# Patient Record
Sex: Male | Born: 1955 | Race: White | Hispanic: No | Marital: Single | Smoking: Former smoker
Health system: Southern US, Community
[De-identification: ages and names within clinical notes are randomized; demographics above are authoritative.]

## PROBLEM LIST (undated history)

## (undated) DIAGNOSIS — Q240 Dextrocardia: Secondary | ICD-10-CM

## (undated) DIAGNOSIS — E7800 Pure hypercholesterolemia, unspecified: Secondary | ICD-10-CM

## (undated) DIAGNOSIS — E119 Type 2 diabetes mellitus without complications: Secondary | ICD-10-CM

## (undated) DIAGNOSIS — R911 Solitary pulmonary nodule: Secondary | ICD-10-CM

## (undated) DIAGNOSIS — J329 Chronic sinusitis, unspecified: Secondary | ICD-10-CM

## (undated) DIAGNOSIS — G4730 Sleep apnea, unspecified: Secondary | ICD-10-CM

## (undated) DIAGNOSIS — I10 Essential (primary) hypertension: Secondary | ICD-10-CM

## (undated) DIAGNOSIS — J45909 Unspecified asthma, uncomplicated: Secondary | ICD-10-CM

## (undated) DIAGNOSIS — J449 Chronic obstructive pulmonary disease, unspecified: Secondary | ICD-10-CM

## (undated) HISTORY — PX: CHOLECYSTECTOMY: SHX55

---

## 2013-09-11 ENCOUNTER — Emergency Department (HOSPITAL_COMMUNITY)
Admission: EM | Admit: 2013-09-11 | Discharge: 2013-09-11 | Attending: Emergency Medicine | Admitting: Emergency Medicine

## 2013-09-11 ENCOUNTER — Encounter (HOSPITAL_COMMUNITY): Payer: Self-pay | Admitting: Emergency Medicine

## 2013-09-11 ENCOUNTER — Emergency Department (HOSPITAL_COMMUNITY)

## 2013-09-11 DIAGNOSIS — Q248 Other specified congenital malformations of heart: Secondary | ICD-10-CM | POA: Insufficient documentation

## 2013-09-11 DIAGNOSIS — I10 Essential (primary) hypertension: Secondary | ICD-10-CM | POA: Insufficient documentation

## 2013-09-11 DIAGNOSIS — J90 Pleural effusion, not elsewhere classified: Secondary | ICD-10-CM | POA: Insufficient documentation

## 2013-09-11 DIAGNOSIS — J45901 Unspecified asthma with (acute) exacerbation: Principal | ICD-10-CM

## 2013-09-11 DIAGNOSIS — E119 Type 2 diabetes mellitus without complications: Secondary | ICD-10-CM | POA: Insufficient documentation

## 2013-09-11 DIAGNOSIS — J45909 Unspecified asthma, uncomplicated: Secondary | ICD-10-CM

## 2013-09-11 DIAGNOSIS — Z87891 Personal history of nicotine dependence: Secondary | ICD-10-CM | POA: Insufficient documentation

## 2013-09-11 DIAGNOSIS — J441 Chronic obstructive pulmonary disease with (acute) exacerbation: Secondary | ICD-10-CM | POA: Insufficient documentation

## 2013-09-11 HISTORY — DX: Dextrocardia: Q24.0

## 2013-09-11 HISTORY — DX: Chronic sinusitis, unspecified: J32.9

## 2013-09-11 HISTORY — DX: Type 2 diabetes mellitus without complications: E11.9

## 2013-09-11 HISTORY — DX: Essential (primary) hypertension: I10

## 2013-09-11 HISTORY — DX: Unspecified asthma, uncomplicated: J45.909

## 2013-09-11 HISTORY — DX: Solitary pulmonary nodule: R91.1

## 2013-09-11 HISTORY — DX: Sleep apnea, unspecified: G47.30

## 2013-09-11 HISTORY — DX: Chronic obstructive pulmonary disease, unspecified: J44.9

## 2013-09-11 HISTORY — DX: Pure hypercholesterolemia, unspecified: E78.00

## 2013-09-11 MED ORDER — ALBUTEROL SULFATE (2.5 MG/3ML) 0.083% IN NEBU
2.5000 mg | INHALATION_SOLUTION | Freq: Once | RESPIRATORY_TRACT | Status: AC
  Administered 2013-09-11: 2.5 mg via RESPIRATORY_TRACT
  Filled 2013-09-11: qty 3

## 2013-09-11 MED ORDER — CIPROFLOXACIN HCL 500 MG PO TABS: 500.0000 mg | ORAL_TABLET | Freq: Two times a day (BID) | ORAL | Status: DC

## 2013-09-11 MED ORDER — PREDNISONE 50 MG PO TABS
60.0000 mg | ORAL_TABLET | Freq: Once | ORAL | Status: AC
  Administered 2013-09-11: 60 mg via ORAL
  Filled 2013-09-11 (×2): qty 1

## 2013-09-11 MED ORDER — IPRATROPIUM-ALBUTEROL 0.5-2.5 (3) MG/3ML IN SOLN
3.0000 mL | Freq: Once | RESPIRATORY_TRACT | Status: AC
  Administered 2013-09-11: 3 mL via RESPIRATORY_TRACT
  Filled 2013-09-11: qty 3

## 2013-09-11 MED ORDER — PREDNISONE 20 MG PO TABS: ORAL_TABLET | ORAL | Status: DC

## 2013-09-11 MED ORDER — ALBUTEROL SULFATE HFA 108 (90 BASE) MCG/ACT IN AERS
2.0000 | INHALATION_SPRAY | Freq: Once | RESPIRATORY_TRACT | Status: AC
  Administered 2013-09-11: 2 via RESPIRATORY_TRACT
  Filled 2013-09-11: qty 6.7

## 2013-09-11 MED ORDER — CIPROFLOXACIN HCL 250 MG PO TABS
500.0000 mg | ORAL_TABLET | Freq: Once | ORAL | Status: AC
  Administered 2013-09-11: 500 mg via ORAL
  Filled 2013-09-11: qty 2

## 2013-09-11 NOTE — ED Notes (Signed)
Pt states he "feels better" after second breathing tx. Pt was able to ambulate to and from restroom without becoming SOB.

## 2013-09-11 NOTE — ED Notes (Addendum)
Pt c/o cough, head and chest congestion,  Sob with exertion, intermittent fever for the past week, cough is productive with brown phlegm, pt has audible wheezing noted at times during triage, states that he last used his inhaler this am, resp therapy paged for breathing tx,

## 2013-09-11 NOTE — Discharge Instructions (Signed)
Chest x-ray shows a left pleural effusion. Recommend repeat chest x-ray on Thursday. Prescriptions for antibiotic and prednisone given. Use your inhaler 2 puffs every 3 hours.  Followup medical system at the facility.

## 2013-09-11 NOTE — ED Provider Notes (Signed)
CSN: [id number]     Arrival date & time 09/11/13  1021 History  This chart was scribed for Donnetta HutchingBrian Tanaysia Bhardwaj, MD by Quintella ReichertMatthew Underwood, ED scribe.  This patient was seen in room APA11/[facility]11 and the patient's care was started at 11:21 AM.   Chief Complaint  Patient presents with  . Cough    The history is provided by the patient. No language interpreter was used.    HPI Comments: Gregory FlattenCalvin Walton is a 58 y.o. male with h/o COPD, asthma, chronic sinus infection, lung nodule, DM, HTN and hyperlipidemia who presents to the Emergency Department complaining of SOB exacerbation that began one week ago.  Pt states he ran out of his albuterol.  He reports he has received breathing treatment and been on antibiotics for prior similar flare-ups.  He is a former smoker.   Past Medical History  Diagnosis Date  . Diabetes mellitus without complication   . Hypertension   . High cholesterol   . COPD (chronic obstructive pulmonary disease)   . Asthma   . Chronic sinus infection   . Sleep apnea   . Lung nodule   . Dextrocardia     Past Surgical History  Procedure Laterality Date  . Cholecystectomy      No family history on file.   History  Substance Use Topics  . Smoking status: Former Games developermoker  . Smokeless tobacco: Not on file  . Alcohol Use: No     Review of Systems A complete 10 system review of systems was obtained and all systems are negative except as noted in the HPI and PMH.     Allergies  Diphenhydramine; Drixoral cold-allergy; Pseudoephedrine; and Adhesive  Home Medications   Prior to Admission medications   Not on File   BP 155/71  Pulse 89  Temp(Src) 97.8 F (36.6 C) (Oral)  Resp 20  Ht 5' 7.5" (1.715 m)  Wt 246 lb (111.585 kg)  BMI 37.94 kg/m2  SpO2 96%  Physical Exam  Nursing note and vitals reviewed. Constitutional: He is oriented to person, place, and time. He appears well-developed and well-nourished.  HENT:  Head: Normocephalic and atraumatic.  Eyes:  Conjunctivae and EOM are normal. Pupils are equal, round, and reactive to light.  Neck: Normal range of motion. Neck supple.  Cardiovascular: Normal rate, regular rhythm and normal heart sounds.   Pulmonary/Chest: Effort normal. He has wheezes.  Expiratory wheezing bilaterally  Abdominal: Soft. Bowel sounds are normal.  Musculoskeletal: Normal range of motion.  Neurological: He is alert and oriented to person, place, and time.  Skin: Skin is warm and dry.  Psychiatric: He has a normal mood and affect. His behavior is normal.    ED Course  Procedures (including critical care time)  DIAGNOSTIC STUDIES: Oxygen Saturation is 96% on room air, normal by my interpretation.    COORDINATION OF CARE: 11:26 AM-Discussed treatment plan which includes CXR, prednisone, breathing treatment, inhaler refill, and antibiotics with pt at bedside and pt agreed to plan.     Labs Review Labs Reviewed - No data to display  Imaging Review Dg Chest 2 View  09/11/2013   CLINICAL DATA:  Shortness of breath, cough, congestion common history of known dextro cardia  EXAM: CHEST  2 VIEW  COMPARISON:  None.  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 Vascular pattern is normal. The right lung is clear. On the left, there is a small effusion.  IMPRESSION: Small left pleural effusion of unknown etiology.   Electronically Signed   By: Marcy Salvoaymond  Rubner M.D.   On: 09/11/2013 12:30     EKG Interpretation None      MDM   Final diagnoses:  Asthma  Pleural effusion, left    Patient has asthma/COPD. He feels better after albuterol Atrovent breathing treatments. Discharge medications albuterol inhaler, Cipro 500 mg by mouth, prednisone.  Left pleural effusion discussed. He will get repeat chest x-ray this week.  I personally performed the services described in this documentation, which was scribed in my presence. The recorded information has been reviewed and is accurate.   Donnetta HutchingBrian Jediah Horger, MD 09/12/13 [phone]

## 2013-12-09 ENCOUNTER — Emergency Department (HOSPITAL_COMMUNITY)
Admission: EM | Admit: 2013-12-09 | Discharge: 2013-12-09 | Disposition: A | Discharge status: Home / Self Care | Attending: Emergency Medicine | Admitting: Emergency Medicine

## 2013-12-09 ENCOUNTER — Encounter (HOSPITAL_COMMUNITY): Payer: Self-pay | Admitting: Emergency Medicine

## 2013-12-09 DIAGNOSIS — Q248 Other specified congenital malformations of heart: Secondary | ICD-10-CM | POA: Insufficient documentation

## 2013-12-09 DIAGNOSIS — I10 Essential (primary) hypertension: Secondary | ICD-10-CM | POA: Insufficient documentation

## 2013-12-09 DIAGNOSIS — R112 Nausea with vomiting, unspecified: Secondary | ICD-10-CM | POA: Insufficient documentation

## 2013-12-09 DIAGNOSIS — J029 Acute pharyngitis, unspecified: Secondary | ICD-10-CM | POA: Insufficient documentation

## 2013-12-09 DIAGNOSIS — J45901 Unspecified asthma with (acute) exacerbation: Secondary | ICD-10-CM

## 2013-12-09 DIAGNOSIS — R000 Tachycardia, unspecified: Secondary | ICD-10-CM | POA: Insufficient documentation

## 2013-12-09 DIAGNOSIS — J441 Chronic obstructive pulmonary disease with (acute) exacerbation: Secondary | ICD-10-CM | POA: Insufficient documentation

## 2013-12-09 DIAGNOSIS — L509 Urticaria, unspecified: Secondary | ICD-10-CM | POA: Insufficient documentation

## 2013-12-09 DIAGNOSIS — Z794 Long term (current) use of insulin: Secondary | ICD-10-CM | POA: Insufficient documentation

## 2013-12-09 DIAGNOSIS — M549 Dorsalgia, unspecified: Secondary | ICD-10-CM | POA: Insufficient documentation

## 2013-12-09 DIAGNOSIS — E7800 Pure hypercholesterolemia, unspecified: Secondary | ICD-10-CM | POA: Insufficient documentation

## 2013-12-09 DIAGNOSIS — R51 Headache: Secondary | ICD-10-CM | POA: Insufficient documentation

## 2013-12-09 DIAGNOSIS — E119 Type 2 diabetes mellitus without complications: Secondary | ICD-10-CM | POA: Insufficient documentation

## 2013-12-09 DIAGNOSIS — Z7982 Long term (current) use of aspirin: Secondary | ICD-10-CM | POA: Insufficient documentation

## 2013-12-09 DIAGNOSIS — Z87891 Personal history of nicotine dependence: Secondary | ICD-10-CM | POA: Insufficient documentation

## 2013-12-09 DIAGNOSIS — R42 Dizziness and giddiness: Secondary | ICD-10-CM | POA: Insufficient documentation

## 2013-12-09 DIAGNOSIS — IMO0002 Reserved for concepts with insufficient information to code with codable children: Secondary | ICD-10-CM | POA: Insufficient documentation

## 2013-12-09 MED ORDER — IPRATROPIUM-ALBUTEROL 0.5-2.5 (3) MG/3ML IN SOLN
3.0000 mL | Freq: Once | RESPIRATORY_TRACT | Status: AC
  Administered 2013-12-09: 3 mL via RESPIRATORY_TRACT
  Filled 2013-12-09: qty 3

## 2013-12-09 MED ORDER — PREDNISONE 10 MG PO TABS: 20.0000 mg | ORAL_TABLET | Freq: Two times a day (BID) | ORAL | Status: AC

## 2013-12-09 MED ORDER — HYDROXYZINE HCL 25 MG PO TABS: ORAL_TABLET | ORAL | Status: AC

## 2013-12-09 MED ORDER — DEXAMETHASONE SODIUM PHOSPHATE 4 MG/ML IJ SOLN
8.0000 mg | Freq: Once | INTRAMUSCULAR | Status: AC
  Administered 2013-12-09: 8 mg via INTRAMUSCULAR
  Filled 2013-12-09: qty 2

## 2013-12-09 MED ORDER — HYDROCODONE-ACETAMINOPHEN 5-325 MG PO TABS
1.0000 | ORAL_TABLET | Freq: Once | ORAL | Status: AC
  Administered 2013-12-09: 1 via ORAL
  Filled 2013-12-09: qty 1

## 2013-12-09 MED ORDER — HYDROXYZINE HCL 25 MG PO TABS
25.0000 mg | ORAL_TABLET | Freq: Once | ORAL | Status: AC
  Administered 2013-12-09: 25 mg via ORAL
  Filled 2013-12-09: qty 1

## 2013-12-09 MED ORDER — CETIRIZINE HCL 10 MG PO TABS: 10.0000 mg | ORAL_TABLET | Freq: Every day | ORAL | Status: AC

## 2013-12-09 NOTE — ED Notes (Signed)
Pt reports generalized rash,intermittent fever,nausea x2 days. Pt alert and oriented. nad noted. Airway patent.

## 2013-12-09 NOTE — ED Provider Notes (Signed)
CSN: [id number]     Arrival date & time 12/09/13  [id number] History   First MD Initiated Contact with Patient 12/09/13 0900     Chief Complaint  Patient presents with  . Rash     (Consider location/radiation/quality/duration/timing/severity/associated sxs/prior Treatment) Patient is a 58 y.o. male presenting with rash. The history is provided by the patient.  Rash Location:  Full body Quality: itchiness and redness   Severity:  Moderate Duration:  2 days Timing:  Constant Progression:  Worsening Chronicity:  New Relieved by:  None tried Worsened by:  Nothing tried Ineffective treatments:  None tried Associated symptoms: headaches, nausea, sore throat, throat swelling and vomiting   Associated symptoms: no abdominal pain, no fever, no myalgias, no shortness of breath and not wheezing    Susa LofflerCalvin Campus is a 58 y.o. male who presents to the ED with  [employer] with a rash and itching that started 2 days ago. He reports that this morning the symptoms worsened and he felt nauseated and had one episode of vomiting. He does not know of anything different he has eaten or used. He has not been exposed to any plants or chemicals. PMH significant for COPD with lung node, DM, HTN. He states he had something similar last year and was treated and it got better.   Past Medical History  Diagnosis Date  . Diabetes mellitus without complication   . Hypertension   . High cholesterol   . COPD (chronic obstructive pulmonary disease)   . Asthma   . Chronic sinus infection   . Sleep apnea   . Lung nodule   . Dextrocardia    Past Surgical History  Procedure Laterality Date  . Cholecystectomy     History reviewed. No pertinent family history. History  Substance Use Topics  . Smoking status: Former Games developermoker  . Smokeless tobacco: Not on file  . Alcohol Use: No    Review of Systems  Constitutional: Negative for fever and chills.  HENT: Positive for facial swelling and sore throat.  Negative for ear pain and nosebleeds.   Eyes: Negative for visual disturbance.  Respiratory: Negative for shortness of breath and wheezing.   Gastrointestinal: Positive for nausea and vomiting. Negative for abdominal pain.  Musculoskeletal: Positive for back pain. Negative for myalgias.  Skin: Positive for rash.  Neurological: Positive for light-headedness and headaches.  Psychiatric/Behavioral: Negative for confusion. The patient is not nervous/anxious.       Allergies  Diphenhydramine; Drixoral cold-allergy; Pseudoephedrine; and Adhesive  Home Medications   Prior to Admission medications   Medication Sig Start Date End Date Taking? Authorizing Provider  albuterol (PROVENTIL HFA;VENTOLIN HFA) 108 (90 BASE) MCG/ACT inhaler Inhale 2 puffs into the lungs every 4 (four) hours as needed for wheezing or shortness of breath.    Historical Provider, MD  aspirin EC 81 MG tablet Take 81 mg by mouth daily.    Historical Provider, MD  budesonide-formoterol (SYMBICORT) 80-4.5 MCG/ACT inhaler Inhale 2 puffs into the lungs 2 (two) times daily.    Historical Provider, MD  cilostazol (PLETAL) 50 MG tablet Take 100 mg by mouth daily.    Historical Provider, MD  ciprofloxacin (CIPRO) 500 MG tablet Take 1 tablet (500 mg total) by mouth 2 (two) times daily. One po bid x 7 days 09/11/13   Donnetta HutchingBrian Cook, MD  furosemide (LASIX) 40 MG tablet Take 40 mg by mouth daily.    Historical Provider, MD  gemfibrozil (LOPID) 600 MG tablet Take 600 mg by mouth  2 (two) times daily before a meal.    Historical Provider, MD  glipiZIDE (GLUCOTROL XL) 10 MG 24 hr tablet Take 10 mg by mouth daily with breakfast.    Historical Provider, MD  insulin aspart protamine- aspart (NOVOLOG MIX 70/30) (70-30) 100 UNIT/ML injection Inject 50 Units into the skin 2 (two) times daily with a meal.    Historical Provider, MD  metFORMIN (GLUCOPHAGE) 1000 MG tablet Take 1,000 mg by mouth 2 (two) times daily with a meal.    Historical Provider, MD   metoprolol tartrate (LOPRESSOR) 25 MG tablet Take 25 mg by mouth 2 (two) times daily.    Historical Provider, MD  predniSONE (DELTASONE) 20 MG tablet 3 tabs po day one, then 2 po daily x 4 days 09/11/13   Donnetta Hutching, MD   BP 146/88  Pulse 126  Temp(Src) 98.9 F (37.2 C) (Oral)  Resp 18  Ht 5\' 7"  (1.702 m)  Wt 235 lb (106.595 kg)  BMI 36.80 kg/m2  SpO2 96% Physical Exam  Nursing note and vitals reviewed. Constitutional: He is oriented to person, place, and time. He appears well-developed and well-nourished. No distress.  HENT:  Head: Normocephalic.  Mouth/Throat: Uvula is midline, oropharynx is clear and moist and mucous membranes are normal.  Eyes: Conjunctivae and EOM are normal.  Neck: Normal range of motion. Neck supple.  Cardiovascular: Tachycardia present.   Pulmonary/Chest: Effort normal. He has wheezes.  Expiratory wheezes bilateral  Abdominal: Soft. There is no tenderness.  Musculoskeletal: Normal range of motion.  Neurological: He is alert and oriented to person, place, and time. No cranial nerve deficit.  Skin: Skin is warm and dry. Rash noted.  Hives noted to upper extremities, chest, back, face, neck and upper legs.   Psychiatric: He has a normal mood and affect. His behavior is normal.    ED Course  Procedures  Albuterol/atrovent neb treatment, Decadron 8 mg IM, Atarax 25 mg PO @ 10:15 patient re examined. Continues to have expiratory wheezing but has improved. Complains of headache s/p treatment. Continues to complain of itching and hives.  Dr. Jeraldine Loots in to examine the patient.  MDM  58 y.o. male with hives and itching x 2 days. Stable for discharge without respiratory distress, difficulty swallowing or swelling of the throat. Will continue out patient treatment and patient will return for worsening symptoms. Discussed with the patient and officers with the patient. All questioned fully answered.    Medication List    TAKE these medications       cetirizine  10 MG tablet  Commonly known as:  ZYRTEC  Take 1 tablet (10 mg total) by mouth daily.     hydrOXYzine 25 MG tablet  Commonly known as:  ATARAX/VISTARIL  Take one tablet PO every 6 hours as needed for itching and allergic reaction     predniSONE 10 MG tablet  Commonly known as:  DELTASONE  Take 2 tablets (20 mg total) by mouth 2 (two) times daily with a meal.      ASK your doctor about these medications       albuterol 108 (90 BASE) MCG/ACT inhaler  Commonly known as:  PROVENTIL HFA;VENTOLIN HFA  Inhale 2 puffs into the lungs every 4 (four) hours as needed for wheezing or shortness of breath.     aspirin EC 81 MG tablet  Take 81 mg by mouth daily.     budesonide-formoterol 80-4.5 MCG/ACT inhaler  Commonly known as:  SYMBICORT  Inhale 2 puffs into the  lungs 2 (two) times daily.     cilostazol 50 MG tablet  Commonly known as:  PLETAL  Take 100 mg by mouth daily.     furosemide 40 MG tablet  Commonly known as:  LASIX  Take 40 mg by mouth daily.     gemfibrozil 600 MG tablet  Commonly known as:  LOPID  Take 600 mg by mouth 2 (two) times daily before a meal.     glipiZIDE 10 MG 24 hr tablet  Commonly known as:  GLUCOTROL XL  Take 10 mg by mouth daily with breakfast.     insulin aspart protamine- aspart (70-30) 100 UNIT/ML injection  Commonly known as:  NOVOLOG MIX 70/30  Inject 35-50 Units into the skin 2 (two) times daily with a meal. 50 in am and 35 in the evening     metFORMIN 1000 MG tablet  Commonly known as:  GLUCOPHAGE  Take 1,000 mg by mouth 2 (two) times daily with a meal.     metoprolol tartrate 25 MG tablet  Commonly known as:  LOPRESSOR  Take 25 mg by mouth 2 (two) times daily.           [facility] Orlene Och, [place] 12/09/13 1141

## 2013-12-09 NOTE — ED Provider Notes (Signed)
  This was a shared visit with a mid-level provided (NP or PA).  Throughout the patient's course I was available for consultation/collaboration.  I saw the ECG (if appropriate), relevant labs and studies - I agree with the interpretation.  On my exam the patient was in no distress.  He had already received antihistamines, as well as albuterol inhaler. With no evidence of respiratory compromise, systemic progression, patient was discharged in stable condition     Gerhard Munchobert Aritzel Krusemark, MD 12/09/13 1342

## 2013-12-09 NOTE — ED Notes (Signed)
Pt alert & oriented x4, stable gait. Patient given discharge instructions, paperwork & prescription(s). Patient  instructed to stop at the registration desk to finish any additional paperwork. Patient verbalized understanding. Pt left department w/ no further questions. 

## 2015-03-25 IMAGING — CR DG CHEST 2V
2 series · 2 of 2 positions shown · non-contrast
Comparison: None.

CLINICAL DATA: Shortness of breath, cough, congestion common
history of known dextro cardia

EXAM:
CHEST  2 VIEW

[view not recorded (1 of 2)]
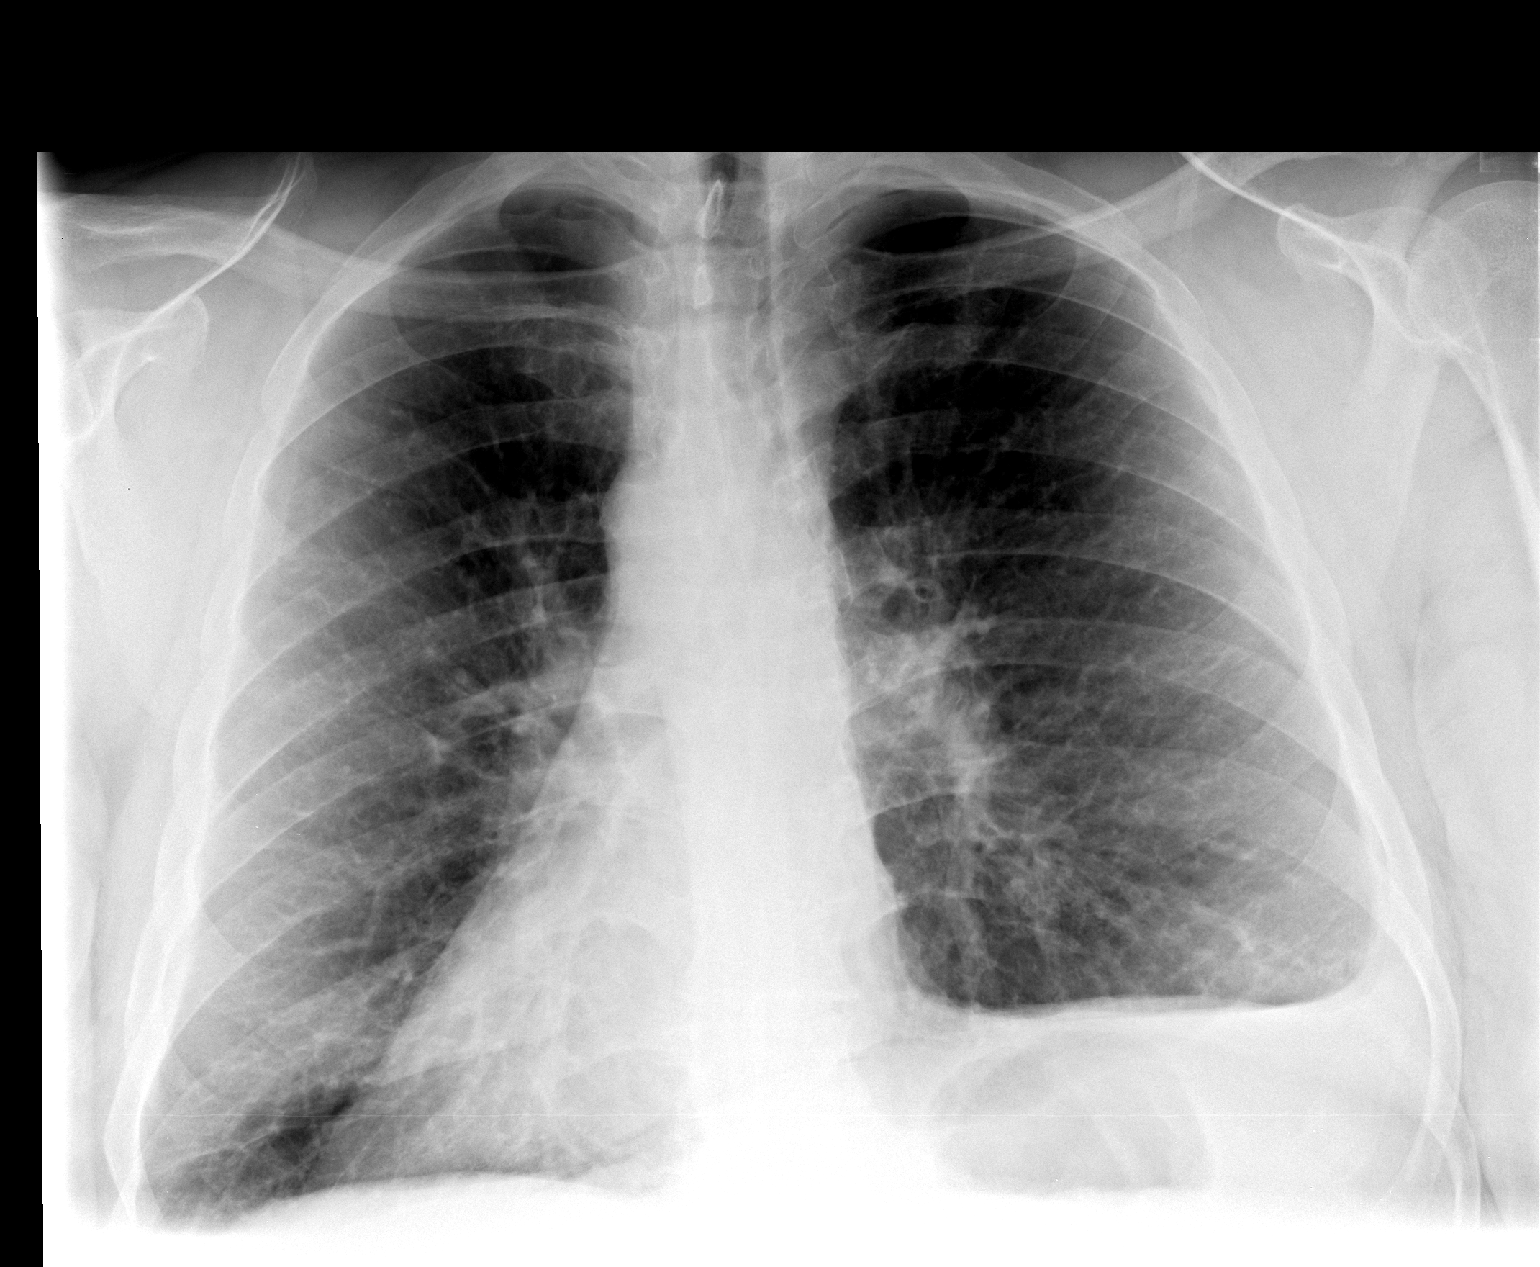

[view not recorded (2 of 2)]
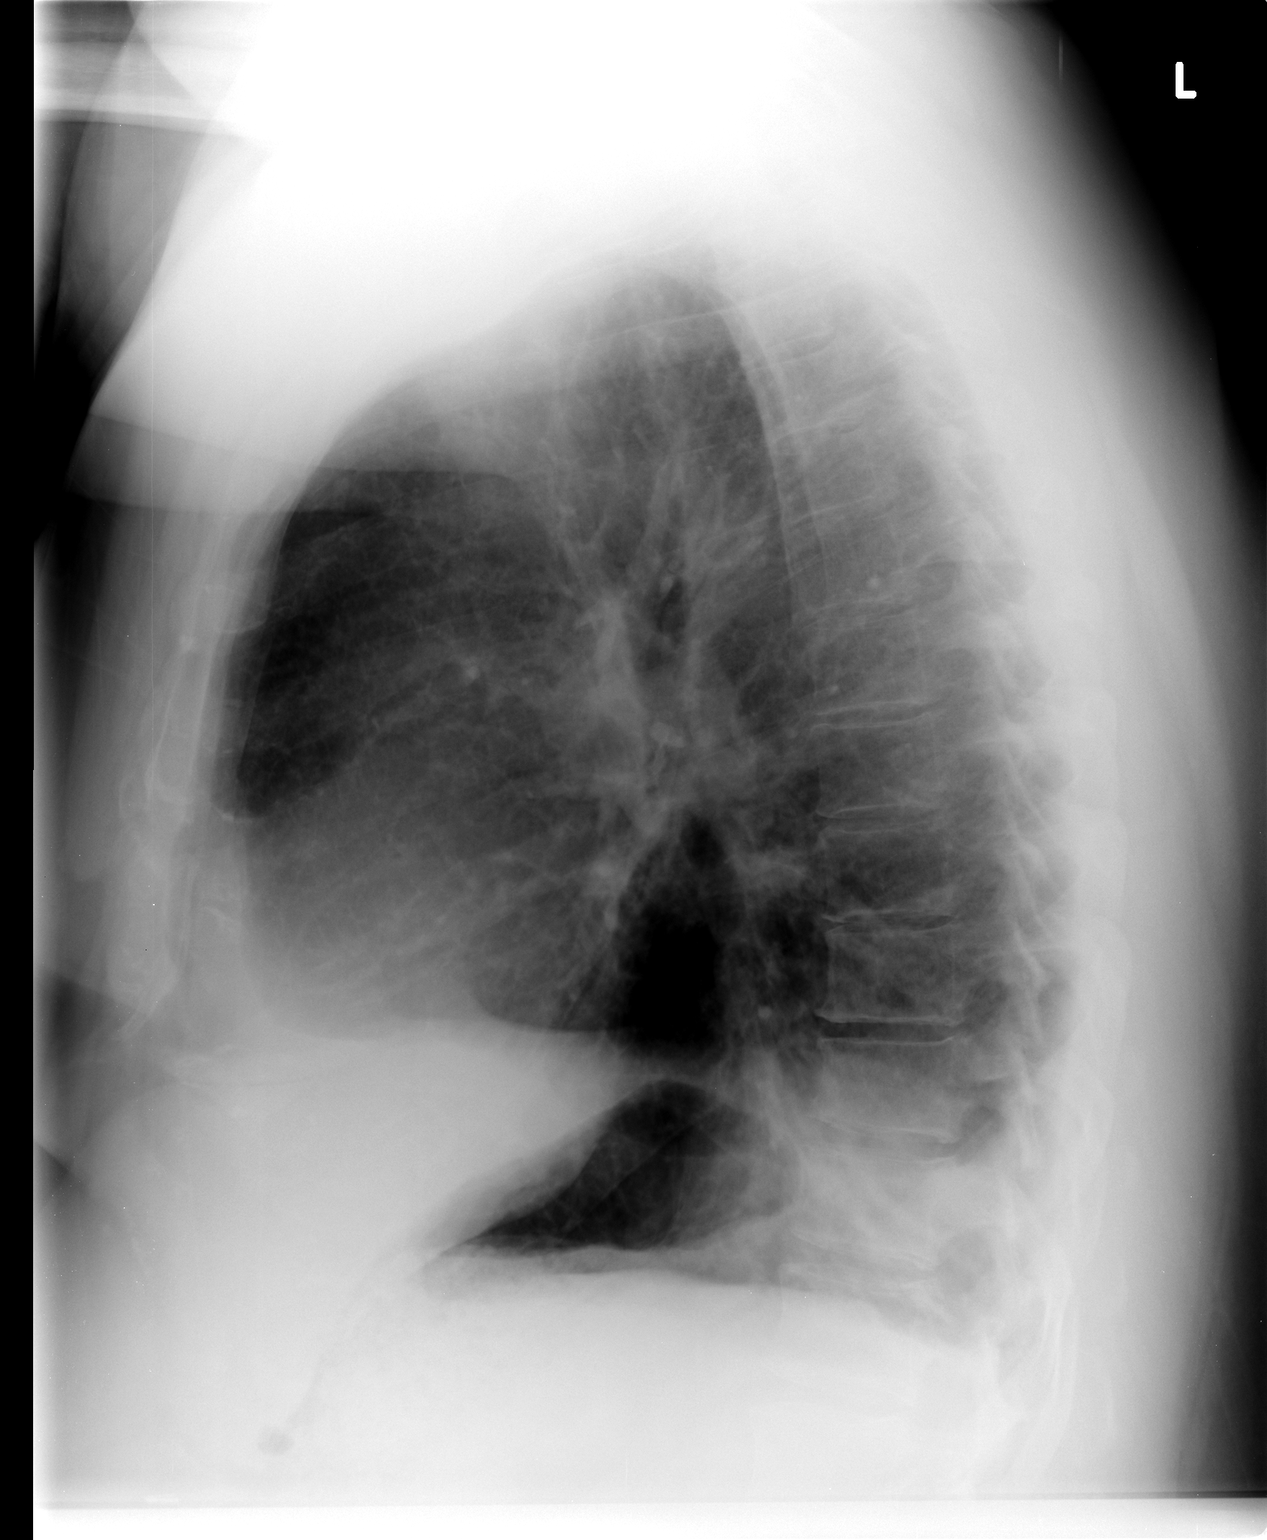

[2 of 2 positions shown; findings below may reference images not displayed]

FINDINGS: There is dextro cardia. Vascular pattern is normal. The right lung
is clear. On the left, there is a small effusion.
IMPRESSION: Small left pleural effusion of unknown etiology.
